# Patient Record
Sex: Male | Born: 1985 | Race: Asian | Hispanic: No | Marital: Single | Smoking: Never smoker
Health system: Southern US, Community
[De-identification: ages and names within clinical notes are randomized; demographics above are authoritative.]

## PROBLEM LIST (undated history)

## (undated) ENCOUNTER — Emergency Department (HOSPITAL_COMMUNITY): Admission: EM | Payer: Self-pay | Source: Home / Self Care

## (undated) HISTORY — PX: OTHER SURGICAL HISTORY: SHX169

---

## 2007-05-23 ENCOUNTER — Ambulatory Visit (HOSPITAL_COMMUNITY): Admission: RE | Admit: 2007-05-23 | Discharge: 2007-05-25 | Payer: Self-pay | Admitting: Orthopedic Surgery

## 2007-05-29 ENCOUNTER — Ambulatory Visit (HOSPITAL_BASED_OUTPATIENT_CLINIC_OR_DEPARTMENT_OTHER): Admission: RE | Admit: 2007-05-29 | Discharge: 2007-05-30 | Payer: Self-pay | Admitting: Orthopedic Surgery

## 2010-07-07 NOTE — Discharge Summary (Signed)
NAME:  Stephen Horne, Stephen Horne                   ACCOUNT NO.:  [id number]   MEDICAL RECORD NO.:  [id number]          PATIENT TYPE:  OIB   LOCATION:  5038                         FACILITY:  [facility]   PHYSICIAN:  Eulas Post, MD    DATE OF BIRTH:  September 07, 1985   DATE OF ADMISSION:  05/23/2007  DATE OF DISCHARGE:  05/25/2007                               DISCHARGE SUMMARY   ADMISSION DIAGNOSIS:  Right lateral and anterior compartment syndrome.   DISCHARGE DIAGNOSIS:  Right lateral and anterior compartment syndrome.   PRIMARY PROCEDURE:  Right leg lateral and anterior compartment  fasciotomy.   DISCHARGE MEDICATIONS:  Include:  1. Keflex.  2. Percocet, dispensing 100.  3. Robaxin, dispensing 60 with 1 refill.   Horne COURSE:  [name] is a 25 year old young man who injured  his right leg and developed a lateral compartment syndrome.  His  anterior compartments were also slightly elevated.  He underwent  emergent compartment release.  He had a wound VAC placed.  He was given  antibiotics while in-house.  He was given a PCA and subsequently  switched to p.o. analgesics.  His creatinine was monitored and his renal  function remained normal.  He had elevated CK enzymes.  He worked with  physical therapy and he is planning to touch toe weightbearing and  discharged home with a wound VAC.  We are going to plan to close his  wounds on this upcoming Monday, approximately 7 days after his  compartment release.  This will be done with an overnight stay.  He had  numbness on the dorsum of his foot preoperatively and postoperatively,  although at the time of discharge, the numbness was improving.  We plan  to see him at the time of surgery next week.      Eulas Post, MD  Electronically Signed     JPL/MEDQ  D:  05/25/2007  T:  05/25/2007  Job:  [id number]

## 2010-07-07 NOTE — Op Note (Signed)
NAME:  Ghattas, [facility]                   ACCOUNT NO.:  [id number]   MEDICAL RECORD NO.:  [id number]           PATIENT TYPE:   LOCATION:                                 FACILITY:   PHYSICIAN:  Eulas Post, MD    DATE OF BIRTH:  Aug 03, 1985   DATE OF PROCEDURE:  DATE OF DISCHARGE:                               OPERATIVE REPORT   PREOPERATIVE DIAGNOSIS:  Right leg compartment syndrome, status post  fasciotomy.   POSTOPERATIVE DIAGNOSIS:  Right leg compartment syndrome, status post  fasciotomy.   OPERATIVE PROCEDURE:  Delayed primary closure of the right lateral leg  wound, 15 cm length.   ANESTHESIA:  General.   ESTIMATED BLOOD LOSS:  20 mL.   PREOPERATIVE INDICATIONS:  Mr. Marque Rademaker is a 25 year old young man who  had a lateral compartment fasciotomy for compartment syndrome.  He had a  wound VAC for approximately 7 days and was brought back to the operating  room for delayed primary closure.  The risks, benefits and alternatives  to operative intervention were discussed with him preoperatively  including but not limited to the risks of infection, bleeding, nerve  injury, recurrent compartment syndrome, the need for skin grafting,  cardiopulmonary complications, permanent long-term dysfunction of the  leg, among others, and he was willing to proceed.   OPERATIVE FINDINGS:  The compartment was quite soft.  There was no  necrotic tissue.  I was able to close the wounds primarily without  difficulty or without undue tension.   OPERATIVE PROCEDURE:  The patient was brought to the operating room and  placed in the supine position.  General anesthesia was administered.  One gram of intravenous Ancef was given.  The right lower extremity was  prepped and draped in the usual sterile fashion.  A 2-0 Vicryl was used  to close the subcutaneous tissue followed by a running 4-0 Monocryl for  the skin.  This was fairly easily closed and did not require undue  tension.  The compartments were  soft after complete closure.  The  patient tolerated the procedure well and there were no complications.  Sterile dressings were applied.  He is going to plan to be admitted  overnight for observation and go home tomorrow, assuming his pain is  appropriate.      Eulas Post, MD  Electronically Signed     JPL/MEDQ  D:  05/29/2007  T:  05/29/2007  Job:  [id number]

## 2010-07-07 NOTE — Op Note (Signed)
NAME:  Stephen Horne, [facility]                   ACCOUNT NO.:  [id number]   MEDICAL RECORD NO.:  [id number]          PATIENT TYPE:  AMB   LOCATION:  MDC                          FACILITY:  [facility]   PHYSICIAN:  Eulas Post, MD    DATE OF BIRTH:  1985-04-18   DATE OF PROCEDURE:  05/23/2007  DATE OF DISCHARGE:                               OPERATIVE REPORT   PREOPERATIVE DIAGNOSIS:  Right lateral compartment syndrome.   POSTOPERATIVE DIAGNOSIS:  Right lateral compartment syndrome.   OPERATIVE PROCEDURE:  1. Right leg decompression fasciotomy of the lateral and anterior      compartments.  2. Measurement of four compartment pressures of the right lower      extremity.   SURGEON:  Eulas Post, MD.   ANESTHESIA:  General.   ESTIMATED BLOOD LOSS:  15 ml.   PREOPERATIVE INDICATIONS:  Stephen Horne is a 25 year old, young  student, who fell after landing while playing football last night.  He  had increasing right lower extremity pain over the course of the past  approximately 18 hours.  He initially was going to go to the emergency  room, but the wait was too long so he suffered through it last night and  then presented to an outside facility this morning and was referred to  our clinic and was evaluated in our clinic at approximately 2:30 p.m.  He was found to have an acute lateral compartment syndrome.  He was then  brought to the hospital for emergent fasciotomy.  The risks, benefits,  and alternatives to the operative intervention were discussed with him  preoperatively including but not limited to the risks of infection,  bleeding, nerve injury, loss of function in the lower extremity, muscle  necrosis, the need for a second surgery to get his wounds closed,  permanent neurologic loss, also loss of limb and loss of function.  The  patient elected to proceed.   OPERATIVE FINDINGS:  The lateral compartment had a compartment pressure  of 93, the anterior compartment was 35, the  posterior compartment was  11, and the deep posterior was 20.  The lateral compartment had evidence  for nonviable muscle.  There was probably about 50% of the peroneal  muscle that was not functioning.  This was under direct Bovie  stimulation.  The proximal portion of the muscle was working.  The  superficial peroneal nerve was visualized and protected throughout the  case.  The anterior compartment appeared normal.   OPERATIVE PROCEDURE:  The patient was brought to the operating room and  placed in the supine position.  One gram of intravenous Ancef was given.  The right lower extremity was prepped and draped in the usual sterile  fashion.  Stryker evaluation of the compartment pressures was performed.  Compartment pressures were measured, and decision was made to perform a  lateral and anterior fasciotomy.  A long incision was made along the  lateral aspect of the lateral compartment.  The skin was released  followed by a fascial release.  The superficial peroneal nerve was  identified  and protected.  Complete decompression was achieved.  The  muscle had a significant amount of hemorrhagic and some, maybe less than  50%, nonviable muscle, but there was no completely necrotic muscle and  the appropriate tissue was debrided.  We then irrigated the wounds  copiously and applied a wound VAC.  The patient was awakened and  returned to the PACU in a stable, satisfactory condition.  There were no  complications, and the patient tolerated the procedure well.   We are going to plan to return to the operating room within the next  five to seven days for wound closure.  This will happen once his  swelling has subsided.      Eulas Post, MD  Electronically Signed     JPL/MEDQ  D:  05/23/2007  T:  05/23/2007  Job:  [id number]

## 2010-07-07 NOTE — H&P (Signed)
NAME:  [name], [facility]                   ACCOUNT NO.:  [id number]   MEDICAL RECORD NO.:  [id number]          PATIENT TYPE:  OIB   LOCATION:  2550                         FACILITY:  [facility]   PHYSICIAN:  Eulas Post, MD    DATE OF BIRTH:  [birth year]   DATE OF ADMISSION:  05/23/2007  DATE OF DISCHARGE:                              HISTORY & PHYSICAL   CHIEF COMPLAINT:  Right leg pain.   HISTORY:  [name] is a 25 year old young man who was playing  football yesterday when he landed and felt an acute sharp pain in his  lateral leg.  He went to the emergency room last night, but there was a  4-hour wait, so he returned home.  He said he has severe pain overnight  and was unable to sleep.  He has had progressively increasing pain over  the course of the day.  He localizes the pain directly over his lateral  leg at the mid calf region.  He describes it as being both sharp and  dull and he has noted swelling.  He says any kind of activity makes it  worse.  He has been taking Vicodin, but it has not helped at all.   REVIEW OF SYSTEMS:  He denies any recent chest pain, shortness of breath  weight loss, vision problems or any bowel or bladder problems.  He  reports a recent upper respiratory tract infection.  He denies any  recent rashes.  He denies any nerve or psychiatric problems or endocrine  problems or easy bruising.  He denies any throat problems.  Musculoskeletal review of systems as above.   PAST MEDICAL HISTORY:  He denies any medical problems.   FAMILY HISTORY:  This is somewhat unknown because he does not know his  entire family history, but he denies any history of compartment syndrome  or easy bruising or bleeding problems.   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   ALLERGIES:  NO KNOWN DRUG ALLERGIES.   MEDICATIONS:  Vicodin as needed, acutely, but nothing long term.   PHYSICAL EXAM:  He is in mild distress lying on the table.  He is well-  developed,  well-nourished.  He has an athletic build.  EYES: His extraocular movements are intact.  HEAD:  Normocephalic, atraumatic.  NECK:  He has full range of motion with no radiculopathy and he has  midline trachea with no masses.  CARDIOVASCULAR:  He has no peripheral  edema, but he does have a tense lateral compartment with some soft  tissue swelling around the leg laterally.  LYMPHATIC:  He has no cervical or axillary lymphadenopathy.  RESPIRATORY:  He has no increase in respirations and no evidence of  cyanosis.  GI:  His abdomen is soft and he has no hepatosplenomegaly.  PSYCHIATRIC: His mood and affect are normal and his judgment and insight  are intact.  NEUROLOGIC:  He has sensation intact throughout his left lower  extremity.  The right lower extremity sensation is intact medially and  along the plantar aspect of the  foot.  He has decreased sensation in the  distribution of the superficial peroneal nerve.  The deep peroneal nerve  at the webspace has some slight decreased sensation as well, although  this is better than the dorsum of the foot.  MUSCULOSKELETAL: His gait is unable to be assessed due to pain.  His  digits and nails are normal and he has good distal pulses.  His left  lower extremity at the level of the ankle has full range of motion 5/5  strength.  No evidence for instability and no pain to palpation.  The  right lower extremity has a tense lateral compartment.  The posterior  compartments all are normal to palpation.  The anterior compartment also  feels soft.  He has severe excruciating pain with passive inversion of  the ankle.  His extensor hallucis longus and flexor hallicis longus are  firing and he does not have significant pain to passive motion of the  toes.  He does not have significant pain with dorsiflexion of the foot.  The most provocative maneuver is inversion.  I have ordered and  interpreted new x-rays of his leg that were reviewed in the clinic   chart.  Additionally, there is more complete documentation in his chart  as well.  Please see that chart for details from the clinic.   IMPRESSION:  Acute lateral compartment syndrome of the right leg with  neurologic changes and impending permanent loss of muscle function.   PLAN:  Proceed emergently to a fasciotomy with measurement of  compartment pressures.  I do not suspect a posterior compartment or a  deep posterior compartment syndrome, however,  I do not think it is the  anterior compartment is significantly affected, but given the sensory  changes in the proximity to the lateral compartment I am going to plan  to release both his lateral and anterior compartments.  We have  discussed the risks, benefits and alternatives to operative  intervention, including but not limited to, the risks of infection,  bleeding, nerve injury, permanent muscle dysfunction, permanent leg  dysfunction, loss of limb, cardiopulmonary complications, among others  and he is willing to proceed.  We have also discussed that he will  likely end up with a wound VAC at the end of this operation and will  need a secondary closure in the subsequent 5-7 days.  Will plan to admit  him and get a CBC and a chemistry panel as well as a CK level.  He is  going to go emergently to surgery.  He last ate 2 hours ago, and had a  small pizza, but unfortunately he is at risk for long-term function of  his limb and so we will proceed emergently.      Eulas Post, MD  Electronically Signed     JPL/MEDQ  D:  05/23/2007  T:  05/23/2007  Job:  [id number]

## 2010-11-16 LAB — CBC
HCT: 46.3
MCHC: 34.3
Platelets: 289
RDW: 12.8

## 2010-11-16 LAB — BASIC METABOLIC PANEL
BUN: 10
CO2: 26
Calcium: 9.2
GFR calc non Af Amer: >60
Glucose, Bld: 124 — ABNORMAL HIGH
Potassium: 3.5

## 2010-11-17 LAB — CBC
HCT: 42.8
Hemoglobin: 14.1
Hemoglobin: 14.8
Platelets: 208
RBC: 4.49
RDW: 13.1
WBC: 7.4

## 2010-11-17 LAB — BASIC METABOLIC PANEL
Calcium: 8.6
Calcium: 9.1
Creatinine, Ser: 0.77
GFR calc Af Amer: >60
GFR calc non Af Amer: >60
GFR calc non Af Amer: >60
Glucose, Bld: 99
Potassium: 4
Sodium: 135
Sodium: 137

## 2010-11-17 LAB — POCT HEMOGLOBIN-HEMACUE: Hemoglobin: 16.7

## 2013-03-18 ENCOUNTER — Ambulatory Visit (INDEPENDENT_AMBULATORY_CARE_PROVIDER_SITE_OTHER): Payer: BC Managed Care – PPO | Admitting: Internal Medicine

## 2013-03-18 VITALS — BP 100/60 | HR 60 | Temp 98.2°F | Resp 18 | Ht 62.0 in | Wt 119.0 lb

## 2013-03-18 DIAGNOSIS — IMO0002 Reserved for concepts with insufficient information to code with codable children: Secondary | ICD-10-CM

## 2013-03-18 DIAGNOSIS — N529 Male erectile dysfunction, unspecified: Secondary | ICD-10-CM

## 2013-03-18 NOTE — Progress Notes (Signed)
   Subjective:    Patient ID: Stephen Horne, male    DOB: 08/20/1985, 28 y.o.   MRN: [id number]  HPI Extremely athletic male here with partner of 9 years. Has always had a low libido according to partner. Recently they have been trying to get pregnant and needing daily intercourse this led to erections with inability to ejaculate on most occasions. Has been doing ovulatory testing according to a G1 and protocol to try and achieve pregnancy. He denies illnesses, surgeries, past autoimmune problems, any inability to continue high-level exercise. Denies prior sexual trauma. No family history of behavioral problems. Denies anxiety or depression.  Past medical history negative Family history negative   Review of Systems  Constitutional: Negative for fever, activity change, appetite change, fatigue and unexpected weight change.  HENT: Negative for dental problem.   Eyes: Negative for visual disturbance.  Respiratory: Negative for cough, chest tightness, shortness of breath and wheezing.   Cardiovascular: Negative for chest pain, palpitations and leg swelling.  Gastrointestinal: Negative for abdominal pain, diarrhea and constipation.  Endocrine: Negative for polydipsia, polyphagia and polyuria.  Genitourinary: Negative for dysuria, urgency, frequency, discharge, scrotal swelling, difficulty urinating, genital sores, penile pain and testicular pain.  Musculoskeletal: Negative for arthralgias, back pain and gait problem.  Skin: Negative for rash.  Allergic/Immunologic: Negative for immunocompromised state.  Neurological: Negative for tremors, speech difficulty, weakness, light-headedness and headaches.  Hematological: Negative for adenopathy. Does not bruise/bleed easily.  Psychiatric/Behavioral: Negative for behavioral problems, sleep disturbance and dysphoric mood.       Objective:   Physical Exam  Constitutional: He is oriented to person, place, and time. He appears well-developed and  well-nourished. No distress.  HENT:  Head: Normocephalic.  Right Ear: External ear normal.  Left Ear: External ear normal.  Nose: Nose normal.  Mouth/Throat: Oropharynx is clear and moist. No oropharyngeal exudate.  Eyes: Conjunctivae and EOM are normal. Pupils are equal, round, and reactive to light.  Neck: Neck supple. No thyromegaly present.  Cardiovascular: Normal rate, regular rhythm, normal heart sounds and intact distal pulses.   No murmur heard. Pulmonary/Chest: Breath sounds normal. No respiratory distress. He has no wheezes.  Abdominal: Soft. Bowel sounds are normal. He exhibits no mass. There is no tenderness.  Genitourinary: Penis normal.  Testes normal volume without masses/pubic hair pattern normal  Musculoskeletal: Normal range of motion. He exhibits no edema.  Lymphadenopathy:    He has no cervical adenopathy.  Neurological: He is alert and oriented to person, place, and time. He has normal reflexes. No cranial nerve deficit.  Skin: No rash noted.  Psychiatric: He has a normal mood and affect. His behavior is normal. Judgment and thought content normal.          Assessment & Plan:  ED (erectile dysfunction) - Plan: TSH, Comprehensive metabolic panel, Testosterone, free, total  Ref to pleasure Bond if all neg consid urol sp ct

## 2013-03-19 LAB — COMPREHENSIVE METABOLIC PANEL
ALBUMIN: 4.6 g/dL (ref 3.5–5.2)
ALK PHOS: 53 U/L (ref 39–117)
ALT: 17 U/L (ref 0–53)
AST: 20 U/L (ref 0–37)
BILIRUBIN TOTAL: 0.5 mg/dL (ref 0.3–1.2)
BUN: 16 mg/dL (ref 6–23)
CO2: 30 mEq/L (ref 19–32)
CREATININE: 0.9 mg/dL (ref 0.50–1.35)
Calcium: 9.8 mg/dL (ref 8.4–10.5)
Chloride: 100 mEq/L (ref 96–112)
GLUCOSE: 91 mg/dL (ref 70–99)
POTASSIUM: 4.1 meq/L (ref 3.5–5.3)
Sodium: 135 mEq/L (ref 135–145)
Total Protein: 7.4 g/dL (ref 6.0–8.3)

## 2013-03-19 LAB — TSH: TSH: 1.424 u[IU]/mL (ref 0.350–4.500)

## 2013-03-20 LAB — TESTOSTERONE, FREE, TOTAL, SHBG
SEX HORMONE BINDING: 29 nmol/L (ref 13–71)
TESTOSTERONE FREE: 112.6 pg/mL (ref 47.0–244.0)
Testosterone-% Free: 2.3 % (ref 1.6–2.9)
Testosterone: 499 ng/dL (ref 300–890)

## 2013-03-23 NOTE — Addendum Note (Signed)
Addended byLevon Hedger: Delron Comer A on: 03/23/2013 10:15 AM   Modules accepted: Orders

## 2013-03-30 ENCOUNTER — Telehealth: Payer: Self-pay | Admitting: Internal Medicine

## 2013-03-30 MED ORDER — OSELTAMIVIR PHOSPHATE 75 MG PO CAPS: 75.0000 mg | ORAL_CAPSULE | Freq: Two times a day (BID) | ORAL | Status: DC

## 2013-03-30 NOTE — Telephone Encounter (Signed)
Tel  Fev/flu sxt <24hr Meds ordered this encounter  Medications  . oseltamivir (TAMIFLU) 75 MG capsule    Sig: Take 1 capsule (75 mg total) by mouth 2 (two) times daily.    Dispense:  10 capsule    Refill:  0   F/u sun 12noon

## 2013-08-09 ENCOUNTER — Encounter (HOSPITAL_COMMUNITY): Payer: Self-pay | Admitting: Emergency Medicine

## 2013-08-09 ENCOUNTER — Emergency Department (HOSPITAL_COMMUNITY)
Admission: EM | Admit: 2013-08-09 | Discharge: 2013-08-10 | Disposition: A | Discharge status: Home / Self Care | Payer: BC Managed Care – PPO | Attending: Emergency Medicine | Admitting: Emergency Medicine

## 2013-08-09 ENCOUNTER — Emergency Department (HOSPITAL_COMMUNITY): Payer: BC Managed Care – PPO

## 2013-08-09 DIAGNOSIS — Y9366 Activity, soccer: Secondary | ICD-10-CM | POA: Insufficient documentation

## 2013-08-09 DIAGNOSIS — Y92838 Other recreation area as the place of occurrence of the external cause: Secondary | ICD-10-CM

## 2013-08-09 DIAGNOSIS — Y9239 Other specified sports and athletic area as the place of occurrence of the external cause: Secondary | ICD-10-CM | POA: Insufficient documentation

## 2013-08-09 DIAGNOSIS — S42023A Displaced fracture of shaft of unspecified clavicle, initial encounter for closed fracture: Secondary | ICD-10-CM | POA: Insufficient documentation

## 2013-08-09 DIAGNOSIS — W1801XA Striking against sports equipment with subsequent fall, initial encounter: Secondary | ICD-10-CM | POA: Insufficient documentation

## 2013-08-09 DIAGNOSIS — S42002A Fracture of unspecified part of left clavicle, initial encounter for closed fracture: Secondary | ICD-10-CM

## 2013-08-09 MED ORDER — OXYCODONE-ACETAMINOPHEN 5-325 MG PO TABS
1.0000 | ORAL_TABLET | Freq: Once | ORAL | Status: AC
  Administered 2013-08-09: 1 via ORAL
  Filled 2013-08-09: qty 1

## 2013-08-09 NOTE — ED Notes (Signed)
Pt was playing soccer and had a collision with another player and fell over them landing on his chest and hurting his left collar bone area. Pt states that he sat out of the game and thought the rest would help the pain but it did not. Pt states swollen to left clavicle. Pt able to breath deep breaths but states some pain. Pt able to move left arm with some pain.

## 2013-08-10 MED ORDER — OXYCODONE-ACETAMINOPHEN 5-325 MG PO TABS: 2.0000 | ORAL_TABLET | Freq: Four times a day (QID) | ORAL | Status: DC | PRN

## 2013-08-10 NOTE — ED Provider Notes (Signed)
CSN: [id number]     Arrival date & time 08/09/13  2051 History   First MD Initiated Contact with Patient 08/09/13 2243     Chief Complaint  Patient presents with  . Fall  . Chest Injury     (Consider location/radiation/quality/duration/timing/severity/associated sxs/prior Treatment) HPI Comments: Patient presents to the emergency department with chief complaint of fall. He states that he was playing soccer, and fell and landed on her shoulder. He is complaining of left shoulder pain. States the pain is moderate to severe. It is aggravated by movement. Nothing makes it better. He has not taken anything to alleviate his symptoms.  The history is provided by the patient. No language interpreter was used.    History reviewed. No pertinent past medical history. Past Surgical History  Procedure Laterality Date  . Compartmental syndrome Right     lower right leg   History reviewed. No pertinent family history. History  Substance Use Topics  . Smoking status: Never Smoker   . Smokeless tobacco: Not on file  . Alcohol Use: No    Review of Systems  Constitutional: Negative for fever and chills.  Respiratory: Negative for shortness of breath.   Cardiovascular: Negative for chest pain.  Gastrointestinal: Negative for nausea, vomiting, diarrhea and constipation.  Genitourinary: Negative for dysuria.  Musculoskeletal: Positive for arthralgias.      Allergies  Review of patient's allergies indicates no known allergies.  Home Medications   Prior to Admission medications   Medication Sig Start Date End Date Taking? Authorizing Mashanda Ishibashi  oxyCODONE-acetaminophen (PERCOCET/ROXICET) 5-325 MG per tablet Take 2 tablets by mouth every 6 (six) hours as needed for severe pain. 08/10/13   Roxy Horsemanobert Browning, PA-C   There were no vitals taken for this visit. Physical Exam  Nursing note and vitals reviewed. Constitutional: He is oriented to person, place, and time. He appears well-developed and  well-nourished.  HENT:  Head: Normocephalic and atraumatic.  Eyes: Conjunctivae and EOM are normal.  Neck: Normal range of motion.  Cardiovascular: Normal rate.   Distal pulses intact, with brisk capillary refill  Pulmonary/Chest: Effort normal.  Left clavicle tender to palpation, no tenting of the skin  Abdominal: He exhibits no distension.  Musculoskeletal: Normal range of motion.  Left upper extremity range of motion and strength reduced secondary to pain  Neurological: He is alert and oriented to person, place, and time.  Sensation intact  Skin: Skin is dry.  Psychiatric: He has a normal mood and affect. His behavior is normal. Judgment and thought content normal.    ED Course  Procedures (including critical care time) Labs Review Labs Reviewed - No data to display  Imaging Review Dg Chest 2 View  08/09/2013   CLINICAL DATA:  Left clavicular fracture, status post fall. Assess for chest injury.  EXAM: CHEST  2 VIEW  COMPARISON:  None.  FINDINGS: The lungs are well-aerated and clear. There is no evidence of focal opacification, pleural effusion or pneumothorax.  The heart is normal in size; the mediastinal contour is within normal limits. A displaced and angulated fracture through the middle third of the left clavicle is noted.  IMPRESSION: 1. No acute cardiopulmonary process seen. 2. Displaced and angulated fracture through the middle third of the left clavicle.   Electronically Signed   By: Roanna RaiderJeffery  Chang M.D.   On: 08/09/2013 23:55   Dg Clavicle Left  08/09/2013   CLINICAL DATA:  Larey SeatFell and landed on the shoulder while playing soccer. Left clavicle pain.  EXAM: LEFT  CLAVICLE - 2+ VIEWS  COMPARISON:  None.  FINDINGS: There is an acute fracture of the midclavicle with downward angulation of the distal fragment. There is associated soft tissue swelling/deformity. The acromioclavicular joint and coracoclavicular ligament appear intact.  IMPRESSION: Mid clavicular fracture.   Electronically  Signed   By: Rosalie GumsBeth  Brown M.D.   On: 08/09/2013 21:44     EKG Interpretation None      MDM   Final diagnoses:  Clavicle fracture, left, closed, initial encounter    Patient with left clavicle fracture. It is a mid clavicle fracture. Will place the patient a sling. Treat pain. Recommend orthopedic followup. Patient is neurovascularly intact. Patient is stable and ready for discharge.    Roxy Horsemanobert Browning, PA-C 08/10/13 [id number]

## 2013-08-10 NOTE — ED Provider Notes (Signed)
Medical screening examination/treatment/procedure(s) were performed by non-physician practitioner and as supervising physician I was immediately available for consultation/collaboration.   EKG Interpretation None       Elliott L Wentz, MD 08/10/13 1622 

## 2013-08-10 NOTE — Discharge Instructions (Signed)
Clavicle Fracture °The clavicle, also called the collarbone, is the long bone that connects your shoulder to your rib cage. You can feel your collarbone at the top of your shoulders and rib cage. A clavicle fracture is a broken clavicle. It is a common injury that can happen at any age.  °CAUSES °Common causes of a clavicle fracture include: °· A direct blow to your shoulder. °· A car accident. °· A fall, especially if you try to break your fall with an outstretched arm. °RISK FACTORS °You may be at increased risk if: °· You are younger than 25 years or older than 75 years. Most clavicle fractures happen to people who are younger than 25 years. °· You are a male. °· You play contact sports. °SIGNS AND SYMPTOMS °A fractured clavicle is painful. It also makes it hard to move your arm. Other signs and symptoms may include: °· A shoulder that drops downward and forward. °· Pain when trying to lift your shoulder. °· Bruising, swelling, and tenderness over your clavicle. °· A grinding noise when you try to move your shoulder. °· A bump over your clavicle. °DIAGNOSIS °Your health care provider can usually diagnose a clavicle fracture by asking about your injury and examining your shoulder and clavicle. He or she may take an X-ray to determine the position of your clavicle. °TREATMENT °Treatment depends on the position of your clavicle after the fracture: °· If the broken ends of the bone are not out of place, your health care provider may put your arm in a sling or wrap a support bandage around your chest (figure-of-eight wrap). °· If the broken ends of the bone are out of place, you may need surgery. Surgery may involve placing screws, pins, or plates to keep your clavicle stable while it heals. Healing may take about 3 months. °When your health care provider thinks your fracture has healed enough, you may have to do physical therapy to regain normal movement and build up your arm strength. °HOME CARE INSTRUCTIONS   °· Apply ice to the injured area: °¨ Put ice in a plastic bag. °¨ Place a towel between your skin and the bag. °¨ Leave the ice on for 20 minutes, 2-3 times a day. °· If you have a wrap or splint: °¨ Wear it all the time, and remove it only to take a bath or shower. °¨ When you bathe or shower, keep your shoulder in the same position as when the sling or wrap is on. °¨ Do not lift your arm. °· If you have a figure-of-eight wrap: °¨ Another person must tighten it every day. °¨ It should be tight enough to hold your shoulders back. °¨ Allow enough room to place your index finger between your body and the strap. °¨ Loosen the wrap immediately if you feel numbness or tingling in your hands. °· Only take medicines as directed by your health care provider. °· Avoid activities that make the injury or pain worse for 4-6 weeks after surgery. °· Keep all follow-up appointments. °SEEK MEDICAL CARE IF:  °Your medicine is not helping to relieve pain and swelling. °SEEK IMMEDIATE MEDICAL CARE IF:  °Your arm is numb, cold, or pale, even when the splint is loose. °MAKE SURE YOU:  °· Understand these instructions. °· Will watch your condition. °· Will get help right away if you are not doing well or get worse. °Document Released: 11/18/2004 Document Revised: 02/13/2013 Document Reviewed: 01/01/2013 °[employer]® Patient Information ©2015 [employer]. This information is   not intended to replace advice given to you by your health care provider. Make sure you discuss any questions you have with your health care provider. ° °

## 2014-01-08 ENCOUNTER — Telehealth: Payer: Self-pay

## 2014-01-08 NOTE — Telephone Encounter (Signed)
Pt called. States he has a fever and is experiencing body aches. Wants to know if Dr. Merla Richesoolittle will call in tamiflu to his pharmacy. [facility] [address] [facility] [address] CB # [phone]

## 2014-01-08 NOTE — Telephone Encounter (Signed)
Pt needs to be evaluated.  Pt advised and will RTC

## 2014-04-15 ENCOUNTER — Ambulatory Visit (INDEPENDENT_AMBULATORY_CARE_PROVIDER_SITE_OTHER): Payer: BLUE CROSS/BLUE SHIELD | Admitting: Family Medicine

## 2014-04-15 VITALS — BP 118/66 | HR 53 | Temp 98.0°F | Resp 17 | Ht 61.0 in | Wt 122.0 lb

## 2014-04-15 DIAGNOSIS — G4489 Other headache syndrome: Secondary | ICD-10-CM

## 2014-04-15 DIAGNOSIS — E860 Dehydration: Secondary | ICD-10-CM

## 2014-04-15 DIAGNOSIS — K5289 Other specified noninfective gastroenteritis and colitis: Secondary | ICD-10-CM

## 2014-04-15 DIAGNOSIS — R112 Nausea with vomiting, unspecified: Secondary | ICD-10-CM

## 2014-04-15 MED ORDER — PROMETHAZINE HCL 25 MG PO TABS: 25.0000 mg | ORAL_TABLET | Freq: Three times a day (TID) | ORAL | Status: DC | PRN

## 2014-04-15 NOTE — Patient Instructions (Signed)

## 2014-04-15 NOTE — Progress Notes (Signed)
 Chief Complaint:  Chief Complaint  Patient presents with  . Nausea  . Emesis  . Headache    HPI: Stephen Horne is a [age] y.o. male who is here for  3 day history of Headache, nausea, and stomach cramps,  No diarrhea, no fevers, no cough or congestion, no CP , sob or wheezing.  No facial pain, dukl HA in frontal area.  He deneis any historyof HAs. He has not been around sick folks  But does work in [employer].  He has tried excerdin with some releif, magnitude and also pepto for the nausea.  He denies any urinary sxs. No abdominal pain but does have quesy. He was able to eat a baked potatoe and also chicken noodle soup, threw up because stomach felt bloated.Marland Kitchen.  Has not been passing gas, has had BM yesterday in the afternoon, normal BM per patient.  No history of peptic ulcers or heart burn.   No past medical history on file. Past Surgical History  Procedure Laterality Date  . Compartmental syndrome Right     lower right leg   History   Social History  . Marital Status: Single    Spouse Name: N/A  . Number of Children: N/A  . Years of Education: N/A   Social History Main Topics  . Smoking status: Never Smoker   . Smokeless tobacco: Not on file  . Alcohol Use: No  . Drug Use: No  . Sexual Activity: Not on file   Other Topics Concern  . None   Social History Narrative   No family history on file. No Known Allergies Prior to Admission medications   Medication Sig Start Date End Date Taking? Authorizing Provider  bismuth subsalicylate (PEPTO BISMOL) 262 MG/15ML suspension Take 30 mLs by mouth every 6 (six) hours as needed.   Yes Historical Provider, MD     ROS: The patient denies fevers, chills, night sweats, unintentional weight loss, chest pain, palpitations, wheezing, dyspnea on exertion, abdominal pain, dysuria, hematuria, melena, numbness, weakness, or tingling.  All other systems have been reviewed and were otherwise negative with the exception of those  mentioned in the HPI and as above.    PHYSICAL EXAM: Filed Vitals:   04/15/14 1021  BP: 118/66  Pulse: 53  Temp: 98 F (36.7 C)  Resp: 17   Filed Vitals:   04/15/14 1021  Height: 5\' 1"  (1.549 m)  Weight: 122 lb (55.339 kg)   Body mass index is 23.06 kg/(m^2).  General: Alert, no acute distress HEENT:  Normocephalic, atraumatic, oropharynx patent. EOMI, PERRLA, + dry mucosa Cardiovascular:  Sinus brady ( works out a lot), no rubs murmurs or gallops.  Radial pulse intact. No pedal edema.  Respiratory: Clear to auscultation bilaterally.  No wheezes, rales, or rhonchi.  No cyanosis, no use of accessory musculature GI: No organomegaly, abdomen is soft and non-tender, positive bowel sounds.  No masses. Skin: No rashes. Neurologic: Facial musculature symmetric. Psychiatric: Patient is appropriate throughout our interaction. Lymphatic: No cervical lymphadenopathy Musculoskeletal: Gait intact. No menigeal signs Fundo exam nl CN 2-12 grossly nl Tm nl  LABS: Results for orders placed or performed in visit on 03/18/13  TSH  Result Value Ref Range   TSH 1.424 0.350 - 4.500 uIU/mL  Comprehensive metabolic panel  Result Value Ref Range   Sodium 135 135 - 145 mEq/L   Potassium 4.1 3.5 - 5.3 mEq/L   Chloride 100 96 - 112 mEq/L   CO2 30 19 -  32 mEq/L   Glucose, Bld 91 70 - 99 mg/dL   BUN 16 6 - 23 mg/dL   Creat 1.61 0.96 - 0.45 mg/dL   Total Bilirubin 0.5 0.3 - 1.2 mg/dL   Alkaline Phosphatase 53 39 - 117 U/L   AST 20 0 - 37 U/L   ALT 17 0 - 53 U/L   Total Protein 7.4 6.0 - 8.3 g/dL   Albumin 4.6 3.5 - 5.2 g/dL   Calcium 9.8 8.4 - 40.9 mg/dL  Testosterone, free, total  Result Value Ref Range   Testosterone 499 300 - 890 ng/dL   Sex Hormone Binding 29 13 - 71 nmol/L   Testosterone, Free 112.6 47.0 - 244.0 pg/mL   Testosterone-% Free 2.3 1.6 - 2.9 %     EKG/XRAY:   Primary read interpreted by Dr. Conley Rolls at [facility]   ASSESSMENT/PLAN: Encounter Diagnoses  Name Primary?  .  Other noninfectious gastroenteritis Yes  . Dehydration   . Other headache syndrome   . Non-intractable vomiting with nausea, vomiting of unspecified type    Declined blood work Note given for work  Rx promethazine  Push fluids at home, BRAT F/u prn   Gross sideeffects, risk and benefits, and alternatives of medications d/w patient. Patient is aware that all medications have potential sideeffects and we are unable to predict every sideeffect or drug-drug interaction that may occur.  ,  PHUONG, DO 04/15/2014 10:50 AM

## 2014-08-05 ENCOUNTER — Ambulatory Visit (INDEPENDENT_AMBULATORY_CARE_PROVIDER_SITE_OTHER): Payer: BLUE CROSS/BLUE SHIELD | Admitting: Family Medicine

## 2014-08-05 VITALS — BP 108/62 | HR 60 | Temp 97.8°F | Resp 18 | Ht 62.25 in | Wt 122.8 lb

## 2014-08-05 DIAGNOSIS — R059 Cough, unspecified: Secondary | ICD-10-CM

## 2014-08-05 DIAGNOSIS — R5383 Other fatigue: Secondary | ICD-10-CM | POA: Diagnosis not present

## 2014-08-05 DIAGNOSIS — R05 Cough: Secondary | ICD-10-CM

## 2014-08-05 DIAGNOSIS — M545 Low back pain, unspecified: Secondary | ICD-10-CM

## 2014-08-05 MED ORDER — ALBUTEROL SULFATE (2.5 MG/3ML) 0.083% IN NEBU
2.5000 mg | INHALATION_SOLUTION | Freq: Once | RESPIRATORY_TRACT | Status: AC
  Administered 2014-08-05: 2.5 mg via RESPIRATORY_TRACT

## 2014-08-05 MED ORDER — MONTELUKAST SODIUM 10 MG PO TABS: 10.0000 mg | ORAL_TABLET | Freq: Every day | ORAL | Status: AC

## 2014-08-05 MED ORDER — FLUTICASONE PROPIONATE 50 MCG/ACT NA SUSP: 2.0000 | Freq: Every day | NASAL | Status: AC

## 2014-08-05 MED ORDER — ALBUTEROL SULFATE 108 (90 BASE) MCG/ACT IN AEPB: 2.0000 | INHALATION_SPRAY | RESPIRATORY_TRACT | Status: AC

## 2014-08-05 MED ORDER — IPRATROPIUM BROMIDE 0.02 % IN SOLN
0.5000 mg | Freq: Once | RESPIRATORY_TRACT | Status: AC
  Administered 2014-08-05: 0.5 mg via RESPIRATORY_TRACT

## 2014-08-05 MED ORDER — BENZONATATE 200 MG PO CAPS: 200.0000 mg | ORAL_CAPSULE | Freq: Three times a day (TID) | ORAL | Status: AC | PRN

## 2014-08-05 MED ORDER — CETIRIZINE HCL 10 MG PO TABS: 10.0000 mg | ORAL_TABLET | Freq: Every day | ORAL | Status: AC

## 2014-08-05 MED ORDER — MELOXICAM 15 MG PO TABS: 15.0000 mg | ORAL_TABLET | Freq: Every day | ORAL | Status: DC

## 2014-08-05 NOTE — Patient Instructions (Addendum)
The most common causes of chronic cough in immunocompetent adults include the following: upper airway cough syndrome (UACS), previously referred to as postnasal drip syndrome (PNDS), which is caused by variety of rhinosinus conditions.  Most likely this is Classic Upper airway cough syndrome, so named because it's frequently impossible to sort out how much is chronic rhinitis/sinusitis with post nasal drip causing throat irritation.  Try at night allergy medications since this is the most effective time to load meds into your system to prevent the morning allergy cascade - lets switch you to zyrtec rather than allegra. If things are not improving, go ahead and fill the prednisone taper but watch out to not catch any other illness as it can suppress your immune system. Suppress your cough and throat clearing with candy and delsym and tessalon pearles, not throat lozenges. Don't use any products with menthol or cough meds other than above.  I would like you to add in singulair and nasal flonase at night as well. I would expect your cough to resolve with in the next week - two at the most - and then when it is resolved, start peeling off the medicines one at a time.  If you hare having breakthrough cough during the day or have an important function, try the tessalon pearles. Try the inhaler 30 minutes before running to see if it fixes the fatigue. If your cough is not significantly improved in 1 wk and totally gone in 2, or if your fatigue persists, then come back so we can check labs and a chest xray.    Cough, Adult  A cough is a reflex that helps clear your throat and airways. It can help heal the body or may be a reaction to an irritated airway. A cough may only last 2 or 3 weeks (acute) or may last more than 8 weeks (chronic).  CAUSES Acute cough:  Viral or bacterial infections. Chronic cough:  Infections.  Allergies.  Asthma.  Post-nasal drip.  Smoking.  Heartburn or acid reflux.  Some  medicines.  Chronic lung problems (COPD).  Cancer. SYMPTOMS   Cough.  Fever.  Chest pain.  Increased breathing rate.  High-pitched whistling sound when breathing (wheezing).  Colored mucus that you cough up (sputum). TREATMENT   A bacterial cough may be treated with antibiotic medicine.  A viral cough must run its course and will not respond to antibiotics.  Your caregiver may recommend other treatments if you have a chronic cough. HOME CARE INSTRUCTIONS   Only take over-the-counter or prescription medicines for pain, discomfort, or fever as directed by your caregiver. Use cough suppressants only as directed by your caregiver.  Use a cold steam vaporizer or humidifier in your bedroom or home to help loosen secretions.  Sleep in a semi-upright position if your cough is worse at night.  Rest as needed.  Stop smoking if you smoke. SEEK IMMEDIATE MEDICAL CARE IF:   You have pus in your sputum.  Your cough starts to worsen.  You cannot control your cough with suppressants and are losing sleep.  You begin coughing up blood.  You have difficulty breathing.  You develop pain which is getting worse or is uncontrolled with medicine.  You have a fever. MAKE SURE YOU:   Understand these instructions.  Will watch your condition.  Will get help right away if you are not doing well or get worse. Document Released: 08/07/2010 Document Revised: 05/03/2011 Document Reviewed: 08/07/2010 [facility] Patient Information 2015 [place], [place]. This information  is not intended to replace advice given to you by your health care provider. Make sure you discuss any questions you have with your health care provider.  

## 2014-08-05 NOTE — Progress Notes (Addendum)
Subjective:   This chart was scribed for Dr. Norberto Sorenson, MD by Jarvis Morgan, Medical Scribe. This patient was seen in Room 12 and the patient's care was started at 4:46 PM.   Patient ID: Stephen Horne, male    DOB: 12/15/1985, 29 y.o.   MRN: [id number]  Chief Complaint  Patient presents with  . Cough    x4-5 week, not getting better or worse. Dry cough.   . Fatigue    x1 month    PCP: No PCP Per Patient  HPI HPI Comments: Stephen Horne is a 29 y.o. male who presents to the [facility] complaining of an intermittent, persistent, waxing and waning, dry cough onset 1 month. Pt denies any associated URI symptoms. He does note some associated fatigue. He states that exercise intermittently triggers the cough. He has a h/o exercise induced asthma for which he has used an inhaler in the past but he states he hasn't used an inhaler in along time. Pt has been taking Allegra with minimal relief. He states he has been sleeping well. He denies any heartburn or indigestion. Pt denies metallic taste in mouth. He is a non smoker. He denies  He denies any chest pain, SOB or wheezing.   There are no active problems to display for this patient.  History reviewed. No pertinent past medical history. Past Surgical History  Procedure Laterality Date  . Compartmental syndrome Right     lower right leg   No Known Allergies Prior to Admission medications   Medication Sig Start Date End Date Taking? Authorizing Provider  bismuth subsalicylate (PEPTO BISMOL) 262 MG/15ML suspension Take 30 mLs by mouth every 6 (six) hours as needed.    Historical Provider, MD  promethazine (PHENERGAN) 25 MG tablet Take 1 tablet (25 mg total) by mouth every 8 (eight) hours as needed for nausea or vomiting. Patient not taking: Reported on 08/05/2014 04/15/14   Lenell Antu, DO   History   Social History  . Marital Status: Single    Spouse Name: N/A  . Number of Children: N/A  . Years of Education: N/A    Occupational History  . Not on file.   Social History Main Topics  . Smoking status: Never Smoker   . Smokeless tobacco: Not on file  . Alcohol Use: No  . Drug Use: No  . Sexual Activity: Not on file   Other Topics Concern  . Not on file   Social History Narrative    Review of Systems  Constitutional: Positive for fatigue. Negative for fever, chills, diaphoresis, activity change, appetite change and unexpected weight change.  HENT: Negative for congestion, ear pain, rhinorrhea, sinus pressure, sneezing, sore throat, trouble swallowing and voice change.   Respiratory: Positive for cough (dry). Negative for apnea, chest tightness, shortness of breath and wheezing.   Cardiovascular: Negative for chest pain and palpitations.  Psychiatric/Behavioral: Negative for sleep disturbance.       Objective:  BP 108/62 mmHg  Pulse 60  Temp(Src) 97.8 F (36.6 C) (Oral)  Resp 18  Ht 5' 2.25" (1.581 m)  Wt 122 lb 12.8 oz (55.702 kg)  BMI 22.28 kg/m2  SpO2 99%    Physical Exam  Constitutional: He is oriented to person, place, and time. He appears well-developed and well-nourished. No distress.  HENT:  Head: Normocephalic and atraumatic.  Right Ear: Tympanic membrane normal.  Left Ear: Tympanic membrane normal.  Nasal mucosa pale and boggy Mild amount of clear  post nasal drip  Eyes: Conjunctivae and EOM are normal.  Neck: Neck supple. No tracheal deviation present. No thyromegaly present.  Cardiovascular: Normal rate, regular rhythm, S1 normal, S2 normal and normal heart sounds.   Pulmonary/Chest: Effort normal. No respiratory distress.  Musculoskeletal: Normal range of motion.  Lymphadenopathy:       Head (right side): No submandibular and no tonsillar adenopathy present.       Head (left side): No submandibular and no tonsillar adenopathy present.    He has no cervical adenopathy.       Right cervical: No posterior cervical adenopathy present.      Left cervical: No posterior  cervical adenopathy present.       Right: No supraclavicular adenopathy present.       Left: No supraclavicular adenopathy present.  Neurological: He is alert and oriented to person, place, and time.  Skin: Skin is warm and dry.  Psychiatric: He has a normal mood and affect. His behavior is normal.  Nursing note and vitals reviewed.  Pre and post peak flow: 630     Assessment & Plan:  Cough - Plan: albuterol (PROVENTIL) (2.5 MG/3ML) 0.083% nebulizer solution 2.5 mg, ipratropium (ATROVENT) nebulizer solution 0.5 mg - no improvement in sxs and no change in peak flow after neb trx in office today - peak flow excellent - suspect due to post-nasal drip - see pt instructions.  Other fatigue  Bilateral low back pain without sciatica   Meds ordered this encounter  Medications  . albuterol (PROVENTIL) (2.5 MG/3ML) 0.083% nebulizer solution 2.5 mg    Sig:   . ipratropium (ATROVENT) nebulizer solution 0.5 mg    Sig:   . Albuterol Sulfate (PROAIR RESPICLICK) 108 (90 BASE) MCG/ACT AEPB    Sig: Inhale 2 puffs into the lungs 30 (thirty) minutes before procedure. (BEFORE EXERCISE)    Dispense:  1 each    Refill:  0  . cetirizine (ZYRTEC) 10 MG tablet    Sig: Take 1 tablet (10 mg total) by mouth at bedtime.    Dispense:  30 tablet    Refill:  11  . montelukast (SINGULAIR) 10 MG tablet    Sig: Take 1 tablet (10 mg total) by mouth at bedtime.    Dispense:  30 tablet    Refill:  3  . fluticasone (FLONASE) 50 MCG/ACT nasal spray    Sig: Place 2 sprays into both nostrils at bedtime.    Dispense:  16 g    Refill:  2  . benzonatate (TESSALON) 200 MG capsule    Sig: Take 1 capsule (200 mg total) by mouth 3 (three) times daily as needed for cough.    Dispense:  40 capsule    Refill:  1  . meloxicam (MOBIC) 15 MG tablet    Sig: Take 1 tablet (15 mg total) by mouth daily. Do not use with any other otc pain medication other than tylenol/acetaminophen    Dispense:  30 tablet    Refill:  1   Over  40 min spent in face-to-face evaluation of and consultation with patient and coordination of care.  Over 50% of this time was spent counseling this patient.  I personally performed the services described in this documentation, which was scribed in my presence. The recorded information has been reviewed and considered, and addended by me as needed.  Norberto Sorenson, MD MPH

## 2014-10-01 ENCOUNTER — Telehealth: Payer: Self-pay

## 2014-10-01 NOTE — Telephone Encounter (Signed)
Wolud be best to come in as will need to consider cbc, tb test, cxr.    The most common causes of chronic cough in immunocompetent adults include the following: upper airway cough syndrome (UACS), previously referred to as postnasal drip syndrome (PNDS), which is caused by variety of rhinosinus conditions.  Most likely this is Classic Upper airway cough syndrome, so named because it's frequently impossible to sort out how much is chronic rhinitis/sinusitis with freq throat clearing and cough is usually the worst at night.  This is why we tried at night allergy medications of zyrtec, singulair, and flonase.  Suppress the cough and throat clearing with candy and delsym and tessalon pearles, not throat lozenges.    If is cough is still dry w/o any nasal congestion the next thing he should try is generic omeprazole or pantoprazole 30 min before dinner every day for a month - silent laryngeal reflux will cause a chronic cough so need to take acid suppressant to see if that helps.

## 2014-10-01 NOTE — Telephone Encounter (Signed)
Pt is still not feeling any better from his 6/13 appointment with  Dr. Clelia Croft for a Cough - Primary. He would like to know what he should do? I told him he should come back for another visit. Please advise at [phone]

## 2014-10-01 NOTE — Telephone Encounter (Signed)
Spoke with pt son and he would like the message to go to Dr. Clelia Croft.

## 2014-10-01 NOTE — Telephone Encounter (Signed)
Advised to RTC. 

## 2014-10-03 NOTE — Telephone Encounter (Signed)
Left message for pt to call back  °

## 2014-10-04 NOTE — Telephone Encounter (Signed)
Spoke with pt, advised message from Dr. Clelia Croft. Pt understood and will try Omeprazole.

## 2014-10-18 ENCOUNTER — Ambulatory Visit (INDEPENDENT_AMBULATORY_CARE_PROVIDER_SITE_OTHER): Payer: BLUE CROSS/BLUE SHIELD | Admitting: Family Medicine

## 2014-10-18 ENCOUNTER — Ambulatory Visit (INDEPENDENT_AMBULATORY_CARE_PROVIDER_SITE_OTHER): Payer: BLUE CROSS/BLUE SHIELD

## 2014-10-18 VITALS — BP 110/76 | HR 60 | Temp 98.6°F | Resp 16 | Ht 62.0 in | Wt 119.8 lb

## 2014-10-18 DIAGNOSIS — G8929 Other chronic pain: Secondary | ICD-10-CM

## 2014-10-18 DIAGNOSIS — R05 Cough: Secondary | ICD-10-CM | POA: Diagnosis not present

## 2014-10-18 DIAGNOSIS — R053 Chronic cough: Secondary | ICD-10-CM

## 2014-10-18 DIAGNOSIS — M545 Low back pain, unspecified: Secondary | ICD-10-CM

## 2014-10-18 DIAGNOSIS — Z23 Encounter for immunization: Secondary | ICD-10-CM

## 2014-10-18 DIAGNOSIS — J189 Pneumonia, unspecified organism: Secondary | ICD-10-CM

## 2014-10-18 MED ORDER — MELOXICAM 15 MG PO TABS: 15.0000 mg | ORAL_TABLET | Freq: Every day | ORAL | Status: AC

## 2014-10-18 MED ORDER — DOXYCYCLINE HYCLATE 100 MG PO CAPS: 100.0000 mg | ORAL_CAPSULE | Freq: Two times a day (BID) | ORAL | Status: AC

## 2014-10-18 NOTE — Progress Notes (Signed)
[facility] [address] [place] [address] [phone]  Date:  10/18/2014   Name:  Stephen Horne   DOB:  Dec 02, 1985   MRN:  [id number]  PCP:  No PCP Per Patient    Chief Complaint: Immunizations; Cough; and Medication Refill   History of Present Illness:  Stephen Horne is a 29 y.o. very pleasant male patient who presents with the following:  Here today with a couple of concerns: he was seen with a cough for a month back in June.   History of EIA.  He was treated with a duoneb with no change in his peak flow.  Sx thought due to PND- we tried singulair, flonase.  However he did not get a lot better.  He also tried omeprazole but it has not made much difference.  He is a little better overall, but still notes that he coughs during the day- most often after eating He likes to run for exercise, and this may be a little worse with running.   He does not have cough attacks He has tried using albuterol prior to running- it has not really helped He will sometimes feel like there is something in his throat and he will cough up some mucus.  No weight loss, fever, chills or sweats He is a non- smoker  He needs to get a tdap- his wife is expecting a baby this fall.  It will be their first  He has noted lower back pain for a couple of years. He has used mobic prn He did have some x-rays a while ago The mobic does help some  There are no active problems to display for this patient.   History reviewed. No pertinent past medical history.  Past Surgical History  Procedure Laterality Date  . Compartmental syndrome Right     lower right leg    Social History  Substance Use Topics  . Smoking status: Never Smoker   . Smokeless tobacco: None  . Alcohol Use: No    History reviewed. No pertinent family history.  No Known Allergies  Medication list has been reviewed and updated.  Current Outpatient Prescriptions on File Prior to Visit  Medication Sig Dispense  Refill  . Albuterol Sulfate (PROAIR RESPICLICK) 108 (90 BASE) MCG/ACT AEPB Inhale 2 puffs into the lungs 30 (thirty) minutes before procedure. (BEFORE EXERCISE) 1 each 0  . meloxicam (MOBIC) 15 MG tablet Take 1 tablet (15 mg total) by mouth daily. Do not use with any other otc pain medication other than tylenol/acetaminophen 30 tablet 1  . benzonatate (TESSALON) 200 MG capsule Take 1 capsule (200 mg total) by mouth 3 (three) times daily as needed for cough. (Patient not taking: Reported on 10/18/2014) 40 capsule 1  . cetirizine (ZYRTEC) 10 MG tablet Take 1 tablet (10 mg total) by mouth at bedtime. (Patient not taking: Reported on 10/18/2014) 30 tablet 11  . fluticasone (FLONASE) 50 MCG/ACT nasal spray Place 2 sprays into both nostrils at bedtime. (Patient not taking: Reported on 10/18/2014) 16 g 2  . montelukast (SINGULAIR) 10 MG tablet Take 1 tablet (10 mg total) by mouth at bedtime. (Patient not taking: Reported on 10/18/2014) 30 tablet 3   No current facility-administered medications on file prior to visit.    Review of Systems:  As per HPI- otherwise negative.   Physical Examination: Filed Vitals:   10/18/14 1059  BP: 110/76  Pulse: 60  Temp: 98.6 F (37 C)  Resp: 16  Filed Vitals:   10/18/14 1059  Height: 5\' 2"  (1.575 m)  Weight: 119 lb 12.8 oz (54.341 kg)   Body mass index is 21.91 kg/(m^2). Ideal Body Weight: Weight in (lb) to have BMI = 25: 136.4  GEN: WDWN, NAD, Non-toxic, A & O x 3, looks well, fit build HEENT: Atraumatic, Normocephalic. Neck supple. No masses, No LAD. Ears and Nose: No external deformity. CV: RRR, No M/G/R. No JVD. No thrill. No extra heart sounds. PULM: CTA B, no wheezes, crackles, rhonchi. No retractions. No resp. distress. No accessory muscle use. EXTR: No c/c/e NEURO Normal gait.  PSYCH: Normally interactive. Conversant. Not depressed or anxious appearing.  Calm demeanor.   [facility] reading (PRIMARY) by  Dr. Patsy Lager. CXR: negative Lumbar spine:  negative  CHEST 2 VIEW  COMPARISON: 08/09/2013  FINDINGS: The heart size and mediastinal contours are within normal limits. Left lower lobe airspace consolidation is identified posteriorly. The visualized skeletal structures are unremarkable.  IMPRESSION: 1. Left lower lobe pneumonia. These results will be called to the ordering clinician or representative by the Radiologist Assistant, and communication documented in the PACS or zVision Dashboard.  LUMBAR SPINE - COMPLETE 4+ VIEW  COMPARISON: None.  FINDINGS: There is no evidence of lumbar spine fracture. Alignment is normal. Intervertebral disc spaces are maintained.  IMPRESSION: Negative. Assessment and Plan: Persistent cough - Plan: DG Chest 2 View, Ambulatory referral to Pulmonology  Immunization due - Plan: Tdap vaccine greater than or equal to 7yo IM  Chronic lower back pain - Plan: DG Lumbar Spine Complete, meloxicam (MOBIC) 15 MG tablet  He declines to see ortho or PT about his back for now- will continue to use mobic as needed, watch for any sign of gastritis Updated tdap in preparation for new baby this fall Received his CXR report- I had not appreciated the pneumonia prior to over-read. Called and alerted him, will start on doxycycline for 10 days. Recommended that he have a repeat CXR in 3-4 months to ensure this clears up, especially if he ends up canceling the pulmonology visit  Signed Abbe Amsterdam, MD

## 2014-10-18 NOTE — Patient Instructions (Signed)
We will refer you to pulmonology to look at your chronic cough Ok to continue mobic for your back- let us know if you have any stomach trouble, or if you do decide to pursue physical therapy

## 2016-04-08 DIAGNOSIS — J019 Acute sinusitis, unspecified: Secondary | ICD-10-CM | POA: Diagnosis not present

## 2016-04-08 DIAGNOSIS — J209 Acute bronchitis, unspecified: Secondary | ICD-10-CM | POA: Diagnosis not present

## 2016-05-31 DIAGNOSIS — R05 Cough: Secondary | ICD-10-CM | POA: Diagnosis not present

## 2016-06-21 DIAGNOSIS — J069 Acute upper respiratory infection, unspecified: Secondary | ICD-10-CM | POA: Diagnosis not present

## 2016-06-21 DIAGNOSIS — J029 Acute pharyngitis, unspecified: Secondary | ICD-10-CM | POA: Diagnosis not present

## 2016-07-26 DIAGNOSIS — M545 Low back pain: Secondary | ICD-10-CM | POA: Diagnosis not present

## 2016-07-26 DIAGNOSIS — R918 Other nonspecific abnormal finding of lung field: Secondary | ICD-10-CM | POA: Diagnosis not present

## 2016-07-26 DIAGNOSIS — R509 Fever, unspecified: Secondary | ICD-10-CM | POA: Diagnosis not present

## 2016-07-26 DIAGNOSIS — R05 Cough: Secondary | ICD-10-CM | POA: Diagnosis not present

## 2016-07-26 DIAGNOSIS — M549 Dorsalgia, unspecified: Secondary | ICD-10-CM | POA: Diagnosis not present

## 2016-07-26 DIAGNOSIS — R1013 Epigastric pain: Secondary | ICD-10-CM | POA: Diagnosis not present

## 2016-07-26 DIAGNOSIS — R1030 Lower abdominal pain, unspecified: Secondary | ICD-10-CM | POA: Diagnosis not present

## 2016-07-26 DIAGNOSIS — J90 Pleural effusion, not elsewhere classified: Secondary | ICD-10-CM | POA: Diagnosis not present

## 2016-07-26 DIAGNOSIS — J181 Lobar pneumonia, unspecified organism: Secondary | ICD-10-CM | POA: Diagnosis not present

## 2016-07-26 DIAGNOSIS — R1084 Generalized abdominal pain: Secondary | ICD-10-CM | POA: Diagnosis not present

## 2016-07-27 DIAGNOSIS — R509 Fever, unspecified: Secondary | ICD-10-CM | POA: Diagnosis not present

## 2016-07-27 DIAGNOSIS — R1084 Generalized abdominal pain: Secondary | ICD-10-CM | POA: Diagnosis not present

## 2016-07-27 DIAGNOSIS — M549 Dorsalgia, unspecified: Secondary | ICD-10-CM | POA: Diagnosis not present

## 2016-07-27 DIAGNOSIS — M545 Low back pain: Secondary | ICD-10-CM | POA: Diagnosis not present

## 2016-07-27 DIAGNOSIS — J90 Pleural effusion, not elsewhere classified: Secondary | ICD-10-CM | POA: Diagnosis not present

## 2016-07-27 DIAGNOSIS — R918 Other nonspecific abnormal finding of lung field: Secondary | ICD-10-CM | POA: Diagnosis not present

## 2016-07-28 DIAGNOSIS — R918 Other nonspecific abnormal finding of lung field: Secondary | ICD-10-CM | POA: Diagnosis not present

## 2016-07-30 DIAGNOSIS — R05 Cough: Secondary | ICD-10-CM | POA: Diagnosis not present

## 2016-07-30 DIAGNOSIS — R918 Other nonspecific abnormal finding of lung field: Secondary | ICD-10-CM | POA: Diagnosis not present

## 2016-08-05 DIAGNOSIS — Z79899 Other long term (current) drug therapy: Secondary | ICD-10-CM | POA: Diagnosis not present

## 2016-08-05 DIAGNOSIS — Q332 Sequestration of lung: Secondary | ICD-10-CM | POA: Diagnosis not present

## 2016-08-05 DIAGNOSIS — R918 Other nonspecific abnormal finding of lung field: Secondary | ICD-10-CM | POA: Diagnosis not present

## 2016-08-05 DIAGNOSIS — J984 Other disorders of lung: Secondary | ICD-10-CM | POA: Diagnosis not present

## 2016-08-16 DIAGNOSIS — R079 Chest pain, unspecified: Secondary | ICD-10-CM | POA: Diagnosis not present

## 2016-08-16 DIAGNOSIS — Z01818 Encounter for other preprocedural examination: Secondary | ICD-10-CM | POA: Diagnosis not present

## 2016-08-16 DIAGNOSIS — R918 Other nonspecific abnormal finding of lung field: Secondary | ICD-10-CM | POA: Diagnosis not present

## 2016-08-17 DIAGNOSIS — K219 Gastro-esophageal reflux disease without esophagitis: Secondary | ICD-10-CM | POA: Diagnosis not present

## 2016-08-17 DIAGNOSIS — J9383 Other pneumothorax: Secondary | ICD-10-CM | POA: Diagnosis not present

## 2016-08-17 DIAGNOSIS — Z9889 Other specified postprocedural states: Secondary | ICD-10-CM | POA: Diagnosis not present

## 2016-08-17 DIAGNOSIS — J9382 Other air leak: Secondary | ICD-10-CM | POA: Diagnosis not present

## 2016-08-17 DIAGNOSIS — Z4682 Encounter for fitting and adjustment of non-vascular catheter: Secondary | ICD-10-CM | POA: Diagnosis not present

## 2016-08-17 DIAGNOSIS — J90 Pleural effusion, not elsewhere classified: Secondary | ICD-10-CM | POA: Diagnosis not present

## 2016-08-17 DIAGNOSIS — Z79899 Other long term (current) drug therapy: Secondary | ICD-10-CM | POA: Diagnosis not present

## 2016-08-17 DIAGNOSIS — J302 Other seasonal allergic rhinitis: Secondary | ICD-10-CM | POA: Diagnosis not present

## 2016-08-17 DIAGNOSIS — Q332 Sequestration of lung: Secondary | ICD-10-CM | POA: Diagnosis not present

## 2016-08-17 DIAGNOSIS — R918 Other nonspecific abnormal finding of lung field: Secondary | ICD-10-CM | POA: Diagnosis not present

## 2016-08-17 DIAGNOSIS — M1990 Unspecified osteoarthritis, unspecified site: Secondary | ICD-10-CM | POA: Diagnosis not present

## 2016-08-17 DIAGNOSIS — J984 Other disorders of lung: Secondary | ICD-10-CM | POA: Diagnosis not present

## 2016-08-17 DIAGNOSIS — M549 Dorsalgia, unspecified: Secondary | ICD-10-CM | POA: Diagnosis not present

## 2016-08-17 DIAGNOSIS — R140 Abdominal distension (gaseous): Secondary | ICD-10-CM | POA: Diagnosis not present

## 2016-08-17 DIAGNOSIS — J939 Pneumothorax, unspecified: Secondary | ICD-10-CM | POA: Diagnosis not present

## 2016-08-17 DIAGNOSIS — G8929 Other chronic pain: Secondary | ICD-10-CM | POA: Diagnosis not present

## 2016-08-17 DIAGNOSIS — J189 Pneumonia, unspecified organism: Secondary | ICD-10-CM | POA: Diagnosis not present

## 2016-08-18 DIAGNOSIS — Z4682 Encounter for fitting and adjustment of non-vascular catheter: Secondary | ICD-10-CM | POA: Diagnosis not present

## 2016-08-18 DIAGNOSIS — J9383 Other pneumothorax: Secondary | ICD-10-CM | POA: Diagnosis not present

## 2016-08-18 DIAGNOSIS — Z9889 Other specified postprocedural states: Secondary | ICD-10-CM | POA: Diagnosis not present

## 2016-08-18 DIAGNOSIS — R140 Abdominal distension (gaseous): Secondary | ICD-10-CM | POA: Diagnosis not present

## 2016-08-19 DIAGNOSIS — Z4682 Encounter for fitting and adjustment of non-vascular catheter: Secondary | ICD-10-CM | POA: Diagnosis not present

## 2016-08-19 DIAGNOSIS — J9383 Other pneumothorax: Secondary | ICD-10-CM | POA: Diagnosis not present

## 2016-08-20 DIAGNOSIS — J939 Pneumothorax, unspecified: Secondary | ICD-10-CM | POA: Diagnosis not present

## 2016-08-20 DIAGNOSIS — Z4682 Encounter for fitting and adjustment of non-vascular catheter: Secondary | ICD-10-CM | POA: Diagnosis not present

## 2016-08-21 DIAGNOSIS — Z4682 Encounter for fitting and adjustment of non-vascular catheter: Secondary | ICD-10-CM | POA: Diagnosis not present

## 2016-08-21 DIAGNOSIS — J939 Pneumothorax, unspecified: Secondary | ICD-10-CM | POA: Diagnosis not present

## 2016-08-22 DIAGNOSIS — J9383 Other pneumothorax: Secondary | ICD-10-CM | POA: Diagnosis not present

## 2016-08-22 DIAGNOSIS — Z4682 Encounter for fitting and adjustment of non-vascular catheter: Secondary | ICD-10-CM | POA: Diagnosis not present

## 2016-08-23 DIAGNOSIS — J9383 Other pneumothorax: Secondary | ICD-10-CM | POA: Diagnosis not present

## 2016-08-23 DIAGNOSIS — Z4682 Encounter for fitting and adjustment of non-vascular catheter: Secondary | ICD-10-CM | POA: Diagnosis not present

## 2016-08-23 DIAGNOSIS — R918 Other nonspecific abnormal finding of lung field: Secondary | ICD-10-CM | POA: Diagnosis not present

## 2016-08-24 DIAGNOSIS — J90 Pleural effusion, not elsewhere classified: Secondary | ICD-10-CM | POA: Diagnosis not present

## 2016-08-24 DIAGNOSIS — J939 Pneumothorax, unspecified: Secondary | ICD-10-CM | POA: Diagnosis not present

## 2016-08-24 DIAGNOSIS — R918 Other nonspecific abnormal finding of lung field: Secondary | ICD-10-CM | POA: Diagnosis not present

## 2016-09-06 ENCOUNTER — Other Ambulatory Visit (HOSPITAL_COMMUNITY): Payer: Self-pay | Admitting: Cardiothoracic Surgery

## 2016-09-06 ENCOUNTER — Ambulatory Visit (HOSPITAL_COMMUNITY)
Admission: RE | Admit: 2016-09-06 | Discharge: 2016-09-06 | Disposition: A | Discharge status: Home / Self Care | Payer: BLUE CROSS/BLUE SHIELD | Source: Ambulatory Visit | Attending: Cardiothoracic Surgery | Admitting: Cardiothoracic Surgery

## 2016-09-06 DIAGNOSIS — Z09 Encounter for follow-up examination after completed treatment for conditions other than malignant neoplasm: Secondary | ICD-10-CM | POA: Insufficient documentation

## 2016-09-06 DIAGNOSIS — Z8781 Personal history of (healed) traumatic fracture: Secondary | ICD-10-CM | POA: Diagnosis not present

## 2016-09-06 DIAGNOSIS — Z902 Acquired absence of lung [part of]: Secondary | ICD-10-CM

## 2016-09-06 DIAGNOSIS — R918 Other nonspecific abnormal finding of lung field: Secondary | ICD-10-CM | POA: Diagnosis not present

## 2016-09-06 DIAGNOSIS — Z9889 Other specified postprocedural states: Secondary | ICD-10-CM | POA: Diagnosis not present

## 2016-11-24 DIAGNOSIS — Q332 Sequestration of lung: Secondary | ICD-10-CM | POA: Diagnosis not present

## 2018-01-17 DIAGNOSIS — B349 Viral infection, unspecified: Secondary | ICD-10-CM | POA: Diagnosis not present

## 2019-02-03 IMAGING — DX DG CHEST 2V
2 series · 2 of 2 positions shown · non-contrast
Comparison: None.

CLINICAL DATA: Status post left lobectomy.

EXAM:
CHEST  2 VIEW

[chest pa]
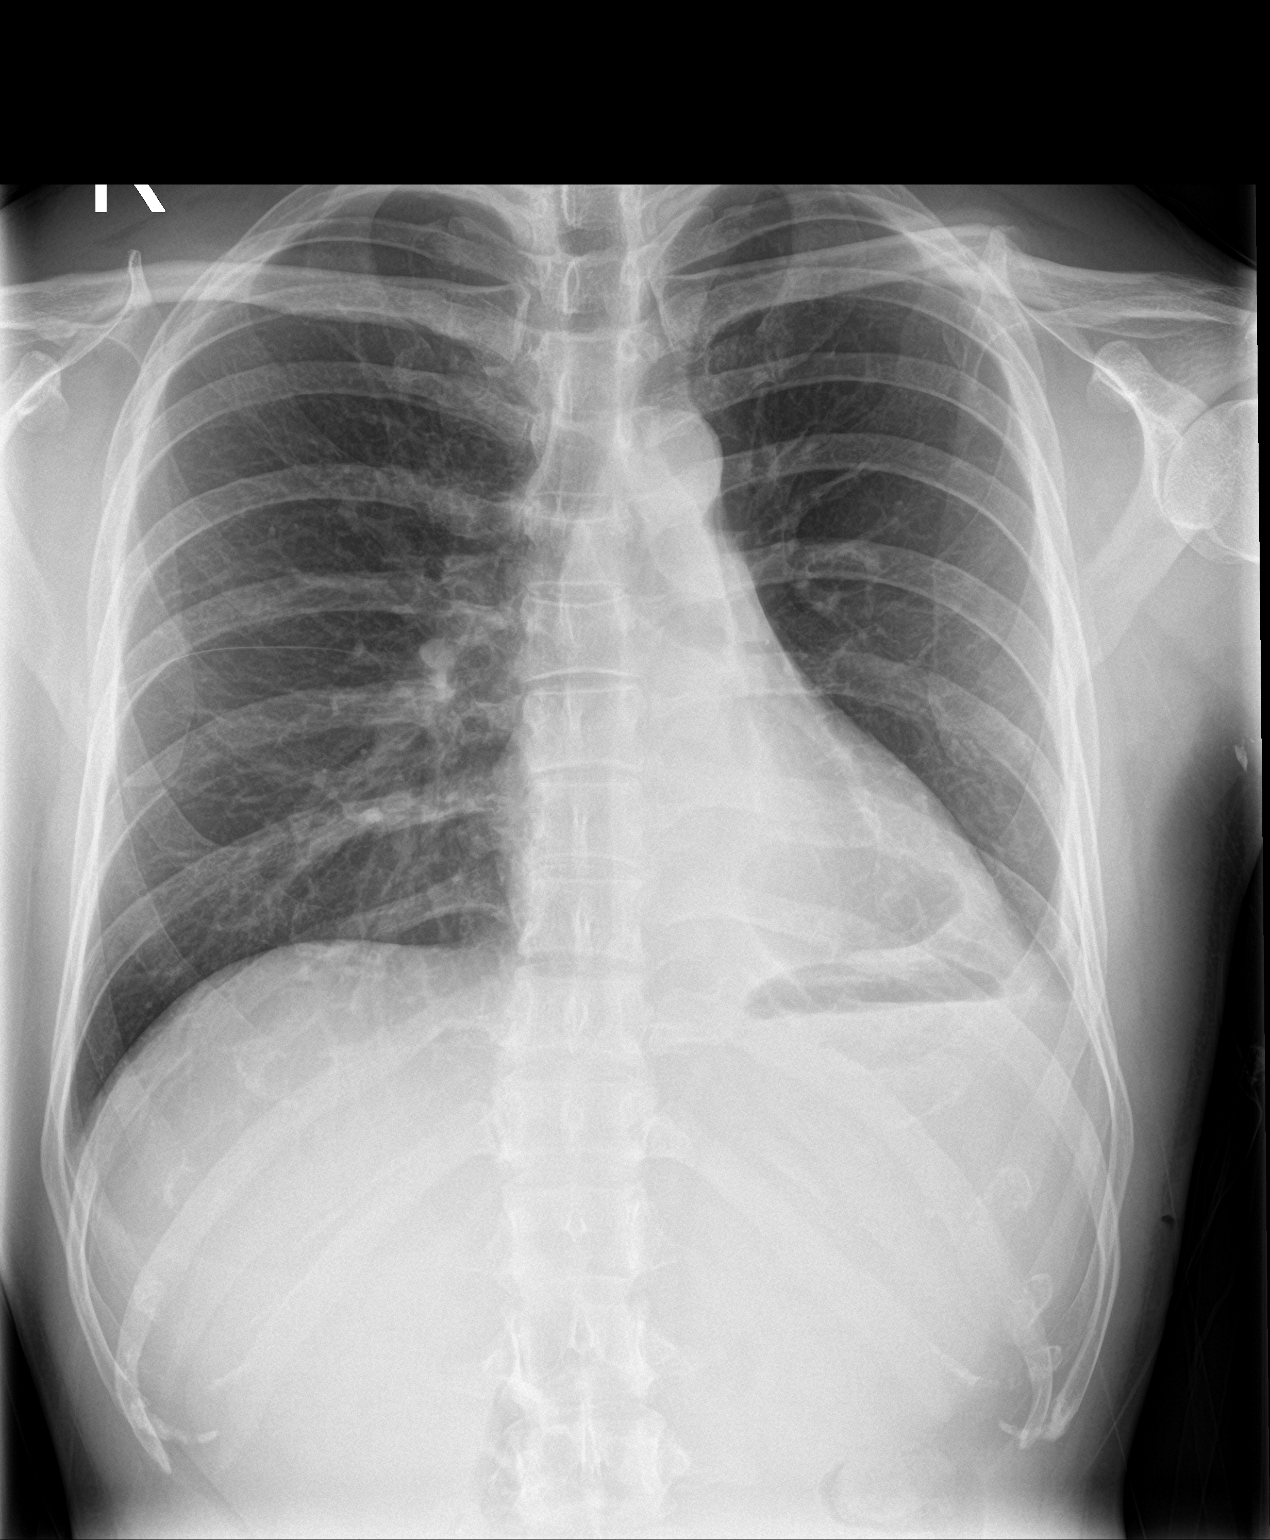

[chest lat]
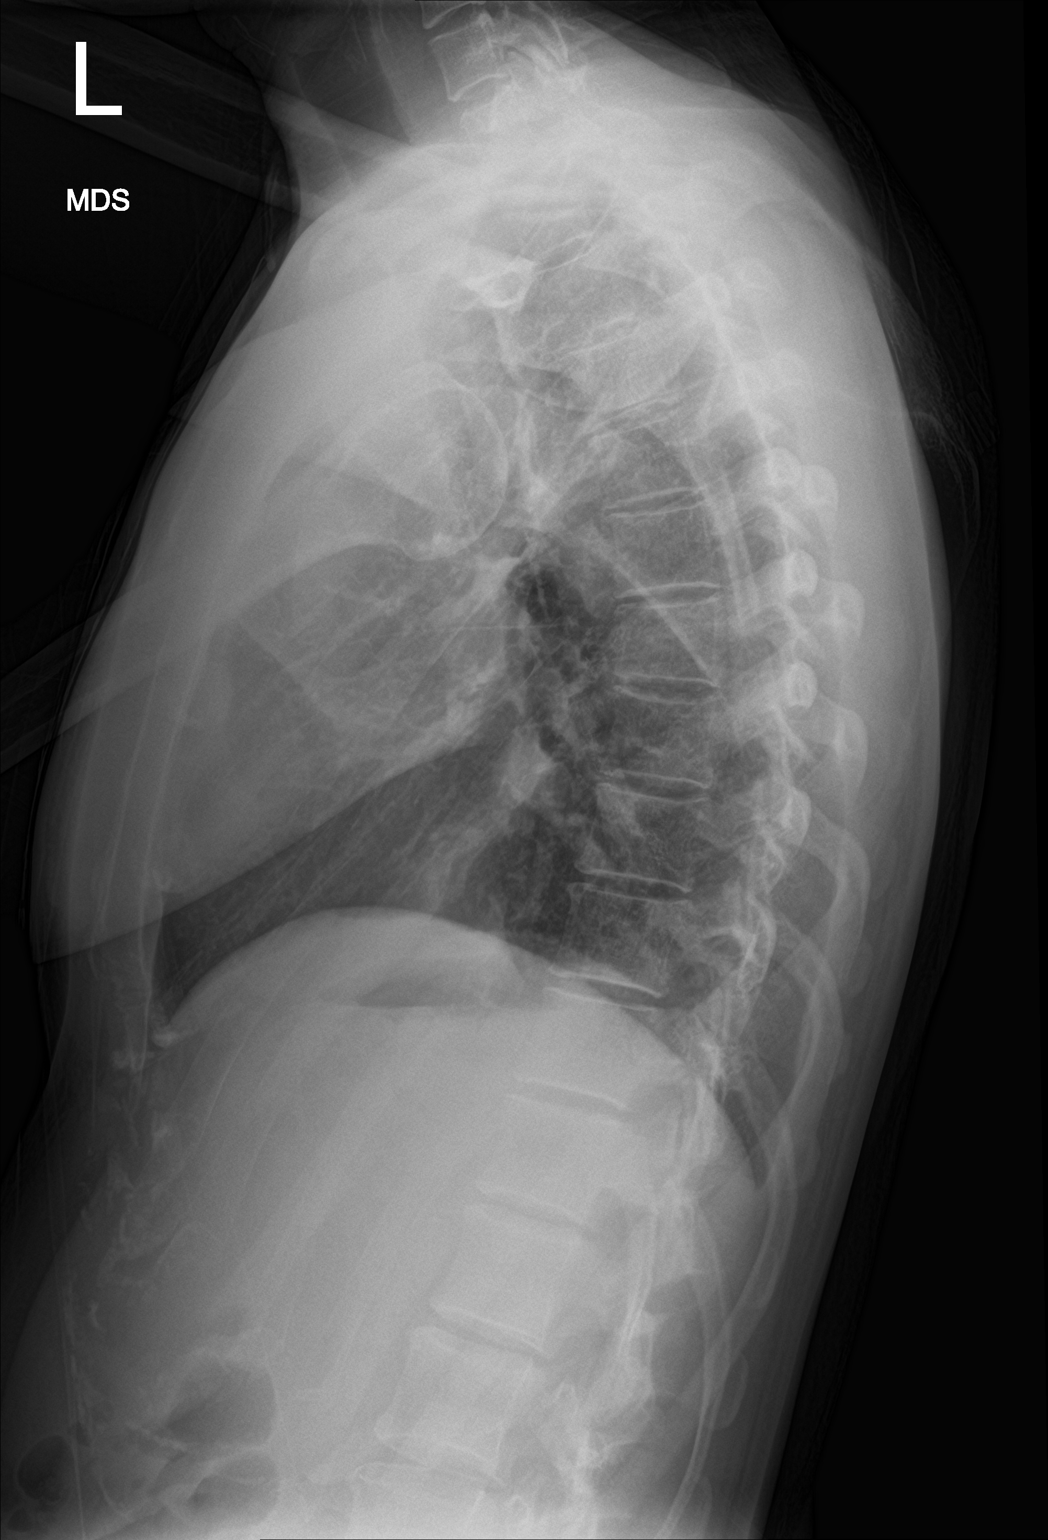

[2 of 2 positions shown; findings below may reference images not displayed]

FINDINGS: Postoperative findings from left lobectomy with bandlike density
along the left lung base and blunting of the left lateral
costophrenic angle. The lungs appear otherwise clear.

Old left mid clavicular fracture.

Heart size within normal limits.  No right-sided abnormality.
IMPRESSION: 1. Bandlike opacity with some central lucency at the left lung base
likely related to recent lobectomy. Minimal blunting of the left
lateral costophrenic angle potentially from pleural fluid.
2. If the patient is having an atypical postoperative course, chest
CT may be warranted.
